# Patient Record
Sex: Female | Born: 1995 | Race: Black or African American | Hispanic: No | Marital: Single | Smoking: Current some day smoker
Health system: Southern US, Community
[De-identification: ages and names within clinical notes are randomized; demographics above are authoritative.]

## PROBLEM LIST (undated history)

## (undated) ENCOUNTER — Inpatient Hospital Stay (HOSPITAL_COMMUNITY): Payer: Self-pay

## (undated) DIAGNOSIS — Z789 Other specified health status: Secondary | ICD-10-CM

## (undated) HISTORY — PX: NO PAST SURGERIES: SHX2092

---
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
## 2015-01-09 ENCOUNTER — Inpatient Hospital Stay (HOSPITAL_COMMUNITY)
Admission: AD | Admit: 2015-01-09 | Discharge: 2015-01-09 | Disposition: A | Discharge status: Home / Self Care | Payer: Self-pay | Source: Ambulatory Visit | Attending: Family Medicine | Admitting: Family Medicine

## 2015-01-09 ENCOUNTER — Encounter (HOSPITAL_COMMUNITY): Payer: Self-pay | Admitting: Student

## 2015-01-09 ENCOUNTER — Inpatient Hospital Stay (HOSPITAL_COMMUNITY): Payer: Self-pay

## 2015-01-09 ENCOUNTER — Inpatient Hospital Stay (HOSPITAL_COMMUNITY): Payer: Medicaid Other

## 2015-01-09 DIAGNOSIS — O23599 Infection of other part of genital tract in pregnancy, unspecified trimester: Secondary | ICD-10-CM | POA: Insufficient documentation

## 2015-01-09 DIAGNOSIS — O3680X0 Pregnancy with inconclusive fetal viability, not applicable or unspecified: Secondary | ICD-10-CM

## 2015-01-09 DIAGNOSIS — B373 Candidiasis of vulva and vagina: Secondary | ICD-10-CM | POA: Insufficient documentation

## 2015-01-09 DIAGNOSIS — O98819 Other maternal infectious and parasitic diseases complicating pregnancy, unspecified trimester: Secondary | ICD-10-CM | POA: Insufficient documentation

## 2015-01-09 DIAGNOSIS — B9689 Other specified bacterial agents as the cause of diseases classified elsewhere: Secondary | ICD-10-CM | POA: Insufficient documentation

## 2015-01-09 DIAGNOSIS — Z3A00 Weeks of gestation of pregnancy not specified: Secondary | ICD-10-CM | POA: Insufficient documentation

## 2015-01-09 DIAGNOSIS — O468X1 Other antepartum hemorrhage, first trimester: Secondary | ICD-10-CM

## 2015-01-09 DIAGNOSIS — O23591 Infection of other part of genital tract in pregnancy, first trimester: Secondary | ICD-10-CM

## 2015-01-09 DIAGNOSIS — R42 Dizziness and giddiness: Secondary | ICD-10-CM | POA: Insufficient documentation

## 2015-01-09 DIAGNOSIS — O209 Hemorrhage in early pregnancy, unspecified: Secondary | ICD-10-CM | POA: Insufficient documentation

## 2015-01-09 DIAGNOSIS — O98811 Other maternal infectious and parasitic diseases complicating pregnancy, first trimester: Secondary | ICD-10-CM

## 2015-01-09 DIAGNOSIS — A499 Bacterial infection, unspecified: Secondary | ICD-10-CM

## 2015-01-09 DIAGNOSIS — B3731 Acute candidiasis of vulva and vagina: Secondary | ICD-10-CM

## 2015-01-09 DIAGNOSIS — O9989 Other specified diseases and conditions complicating pregnancy, childbirth and the puerperium: Secondary | ICD-10-CM | POA: Insufficient documentation

## 2015-01-09 DIAGNOSIS — N760 Acute vaginitis: Secondary | ICD-10-CM | POA: Insufficient documentation

## 2015-01-09 HISTORY — DX: Other specified health status: Z78.9

## 2015-01-09 LAB — COMPREHENSIVE METABOLIC PANEL
ALBUMIN: 4 g/dL (ref 3.5–5.0)
ALK PHOS: 58 U/L (ref 38–126)
ALT: 40 U/L (ref 14–54)
ANION GAP: 9 (ref 5–15)
AST: 53 U/L — AB (ref 15–41)
BILIRUBIN TOTAL: 1.3 mg/dL — AB (ref 0.3–1.2)
BUN: 11 mg/dL (ref 6–20)
CALCIUM: 9.1 mg/dL (ref 8.9–10.3)
CO2: 23 mmol/L (ref 22–32)
Chloride: 101 mmol/L (ref 101–111)
Creatinine, Ser: 0.83 mg/dL (ref 0.44–1.00)
GFR calc Af Amer: >60 mL/min (ref >60)
GLUCOSE: 92 mg/dL (ref 65–99)
Potassium: 3.9 mmol/L (ref 3.5–5.1)
Sodium: 133 mmol/L — ABNORMAL LOW (ref 135–145)
TOTAL PROTEIN: 7.7 g/dL (ref 6.5–8.1)

## 2015-01-09 LAB — URINALYSIS, ROUTINE W REFLEX MICROSCOPIC
Bilirubin Urine: NEGATIVE
GLUCOSE, UA: NEGATIVE mg/dL
KETONES UR: NEGATIVE mg/dL
LEUKOCYTES UA: NEGATIVE
NITRITE: NEGATIVE
PROTEIN: NEGATIVE mg/dL
Specific Gravity, Urine: 1.02 (ref 1.005–1.030)
pH: 6 (ref 5.0–8.0)

## 2015-01-09 LAB — URINE MICROSCOPIC-ADD ON
Bacteria, UA: NONE SEEN
WBC UA: NONE SEEN WBC/hpf (ref 0–5)

## 2015-01-09 LAB — CBC
HCT: 37.9 % (ref 36.0–46.0)
Hemoglobin: 12.4 g/dL (ref 12.0–15.0)
MCH: 26.3 pg (ref 26.0–34.0)
MCHC: 32.7 g/dL (ref 30.0–36.0)
MCV: 80.5 fL (ref 78.0–100.0)
PLATELETS: 247 10*3/uL (ref 150–400)
RBC: 4.71 MIL/uL (ref 3.87–5.11)
RDW: 15.4 % (ref 11.5–15.5)
WBC: 12.9 10*3/uL — ABNORMAL HIGH (ref 4.0–10.5)

## 2015-01-09 LAB — HCG, QUANTITATIVE, PREGNANCY: hCG, Beta Chain, Quant, S: 162 m[IU]/mL — ABNORMAL HIGH (ref <5)

## 2015-01-09 LAB — WET PREP, GENITAL
Sperm: NONE SEEN
Trich, Wet Prep: NONE SEEN

## 2015-01-09 LAB — POCT PREGNANCY, URINE: PREG TEST UR: POSITIVE — AB

## 2015-01-09 LAB — ABO/RH: ABO/RH(D): A POS

## 2015-01-09 MED ORDER — TERCONAZOLE 0.8 % VA CREA: 1.0000 | TOPICAL_CREAM | Freq: Every day | VAGINAL | Status: DC

## 2015-01-09 MED ORDER — METRONIDAZOLE 500 MG PO TABS: 500.0000 mg | ORAL_TABLET | Freq: Two times a day (BID) | ORAL | Status: DC

## 2015-01-09 NOTE — Discharge Instructions (Signed)
Return to care   If you have heavier bleeding that soaks through more that 2 pads per hour for an hour or more  If you bleed so much that you feel like you might pass out or you do pass out  If you have significant abdominal pain that is not improved with Tylenol   If you develop a fever > 100.5   Vaginal Bleeding During Pregnancy, First Trimester A small amount of bleeding (spotting) from the vagina is relatively common in early pregnancy. It usually stops on its own. Various things may cause bleeding or spotting in early pregnancy. Some bleeding may be related to the pregnancy, and some may not. In most cases, the bleeding is normal and is not a problem. However, bleeding can also be a sign of something serious. Be sure to tell your health care provider about any vaginal bleeding right away. Some possible causes of vaginal bleeding during the first trimester include:  Infection or inflammation of the cervix.  Growths (polyps) on the cervix.  Miscarriage or threatened miscarriage.  Pregnancy tissue has developed outside of the uterus and in a fallopian tube (tubal pregnancy).  Tiny cysts have developed in the uterus instead of pregnancy tissue (molar pregnancy). HOME CARE INSTRUCTIONS  Watch your condition for any changes. The following actions may help to lessen any discomfort you are feeling:  Follow your health care provider's instructions for limiting your activity. If your health care provider orders bed rest, you may need to stay in bed and only get up to use the bathroom. However, your health care provider may allow you to continue light activity.  If needed, make plans for someone to help with your regular activities and responsibilities while you are on bed rest.  Keep track of the number of pads you use each day, how often you change pads, and how soaked (saturated) they are. Write this down.  Do not use tampons. Do not douche.  Do not have sexual intercourse or orgasms  until approved by your health care provider.  If you pass any tissue from your vagina, save the tissue so you can show it to your health care provider.  Only take over-the-counter or prescription medicines as directed by your health care provider.  Do not take aspirin because it can make you bleed.  Keep all follow-up appointments as directed by your health care provider. SEEK MEDICAL CARE IF:  You have any vaginal bleeding during any part of your pregnancy.  You have cramps or labor pains.  You have a fever, not controlled by medicine. SEEK IMMEDIATE MEDICAL CARE IF:   You have severe cramps in your back or belly (abdomen).  You pass large clots or tissue from your vagina.  Your bleeding increases.  You feel light-headed or weak, or you have fainting episodes.  You have chills.  You are leaking fluid or have a gush of fluid from your vagina.  You pass out while having a bowel movement. MAKE SURE YOU:  Understand these instructions.  Will watch your condition.  Will get help right away if you are not doing well or get worse.   This information is not intended to replace advice given to you by your health care provider. Make sure you discuss any questions you have with your health care provider.   Document Released: 10/13/2004 Document Revised: 01/08/2013 Document Reviewed: 09/10/2012 Elsevier Interactive Patient Education 2016 Elsevier Inc.    Pelvic Rest Pelvic rest is sometimes recommended for women when:  The placenta is partially or completely covering the opening of the cervix (placenta previa).  There is bleeding between the uterine wall and the amniotic sac in the first trimester (subchorionic hemorrhage).  The cervix begins to open without labor starting (incompetent cervix, cervical insufficiency).  The labor is too early (preterm labor). HOME CARE INSTRUCTIONS  Do not have sexual intercourse, stimulation, or an orgasm.  Do not use tampons,  douche, or put anything in the vagina.  Do not lift anything over 10 pounds (4.5 kg).  Avoid strenuous activity or straining your pelvic muscles. SEEK MEDICAL CARE IF:  You have any vaginal bleeding during pregnancy. Treat this as a potential emergency.  You have cramping pain felt low in the stomach (stronger than menstrual cramps).  You notice vaginal discharge (watery, mucus, or bloody).  You have a low, dull backache.  There are regular contractions or uterine tightening. SEEK IMMEDIATE MEDICAL CARE IF: You have vaginal bleeding and have placenta previa.    This information is not intended to replace advice given to you by your health care provider. Make sure you discuss any questions you have with your health care provider.   Document Released: 04/30/2010 Document Revised: 03/28/2011 Document Reviewed: 07/07/2014 Elsevier Interactive Patient Education [employer] Elsevier Inc.

## 2015-01-09 NOTE — MAU Note (Signed)
RT in to perform EKG

## 2015-01-09 NOTE — MAU Note (Signed)
Period started last Wednesday, was very heavy, went off for a day, started bleeding again yesterday.  Has severe pain on sides bilaterally.  States she "blacked out" twice today, did not lose consciousness.  Nauseated this morning - no vomiting or diarrhea.

## 2015-01-09 NOTE — MAU Note (Signed)
Called respiratory therapy for EKG.  

## 2015-01-09 NOTE — MAU Note (Signed)
E Signature not working.

## 2015-01-09 NOTE — MAU Note (Signed)
Pt. Not in lobby for u/s

## 2015-01-09 NOTE — MAU Provider Note (Signed)
History     CSN: [id number]  Arrival date and time: 01/09/15 1054    First Provider Initiated Contact with Patient 01/09/15 1309      Chief Complaint  Patient presents with  . Vaginal Bleeding  . Abdominal Pain   HPI Michelle Dixon is a 19 y.o. female who presents with vaginal bleeding, abdominal pain, & episodes of "blacking out".  States her period started last Wednesday and has been heavier than normal. Last period before that was the middle of November.  Heavy bleeding last week. Has been light pink bleeding x 2 days.  Abdominal pain x 2 days. Felt sore. Worse on right side.  Denies chest pain or SOB.  At work & got light headed and dizzy & felt like she was going to pass out.  Didn't pass out but states she "blacked out". Symptoms resolved after sitting down.   2 episodes today of dizziness and vision going black for a few seconds.   OB History    Gravida Para Term Preterm AB TAB SAB Ectopic Multiple Living   1               Past Medical History  Diagnosis Date  . Medical history non-contributory     Past Surgical History  Procedure Laterality Date  . No past surgeries      History reviewed. No pertinent family history.  Social History  Substance Use Topics  . Smoking status: Never Smoker   . Smokeless tobacco: None  . Alcohol Use: No    Allergies: No Known Allergies  Prescriptions prior to admission  Medication Sig Dispense Refill Last Dose  . LEVORA 0.15/30, 28, 0.15-30 MG-MCG tablet TK AS DIRECTED  2     Review of Systems  Constitutional: Negative.   Gastrointestinal: Positive for abdominal pain.  Genitourinary:       + vaginal bleeding  Neurological: Positive for dizziness and loss of consciousness.   Physical Exam   Blood pressure 116/67, pulse 115, temperature 98.7 F (37.1 C), temperature source Oral, resp. rate 16, last menstrual period 12/01/2014.   Orthostatic VS for the past 24 hrs:  BP- Lying Pulse- Lying BP- Sitting Pulse-  Sitting BP- Standing at 0 minutes Pulse- Standing at 0 minutes  01/09/15 1543 111/59 mmHg 95 110/53 mmHg 97 107/56 mmHg 113       Physical Exam  Nursing note and vitals reviewed. Constitutional: She is oriented to person, place, and time. She appears well-developed and well-nourished. No distress.  HENT:  Head: Normocephalic and atraumatic.  Eyes: Conjunctivae are normal. Right eye exhibits no discharge. Left eye exhibits no discharge. No scleral icterus.  Neck: Normal range of motion.  Cardiovascular: Normal rate, regular rhythm and normal heart sounds.   No murmur heard. Respiratory: Effort normal and breath sounds normal. No respiratory distress. She has no wheezes.  GI: Soft. Bowel sounds are normal. She exhibits no distension and no mass. There is no tenderness. There is no rebound and no guarding.  Genitourinary: Cervix exhibits no motion tenderness.  Minimal amount of dark red blood on exam.  Cervix closed  Neurological: She is alert and oriented to person, place, and time.  Skin: Skin is warm and dry. She is not diaphoretic.  Psychiatric: She has a normal mood and affect. Her behavior is normal. Judgment and thought content normal.    MAU Course  Procedures Results for orders placed or performed during the hospital encounter of 01/09/15 (from the past 24 hour(s))  Urinalysis, Routine  w reflex microscopic (not [facility])     Status: Abnormal   Collection Time: 01/09/15 11:25 AM  Result Value Ref Range   Color, Urine YELLOW YELLOW   APPearance HAZY (A) CLEAR   Specific Gravity, Urine 1.020 1.005 - 1.030   pH 6.0 5.0 - 8.0   Glucose, UA NEGATIVE NEGATIVE mg/dL   Hgb urine dipstick MODERATE (A) NEGATIVE   Bilirubin Urine NEGATIVE NEGATIVE   Ketones, ur NEGATIVE NEGATIVE mg/dL   Protein, ur NEGATIVE NEGATIVE mg/dL   Nitrite NEGATIVE NEGATIVE   Leukocytes, UA NEGATIVE NEGATIVE  Urine microscopic-add on     Status: Abnormal   Collection Time: 01/09/15 11:25 AM  Result  Value Ref Range   Squamous Epithelial / LPF 0-5 (A) NONE SEEN   WBC, UA NONE SEEN 0 - 5 WBC/hpf   RBC / HPF 0-5 0 - 5 RBC/hpf   Bacteria, UA NONE SEEN NONE SEEN  Pregnancy, urine POC     Status: Abnormal   Collection Time: 01/09/15 11:56 AM  Result Value Ref Range   Preg Test, Ur POSITIVE (A) NEGATIVE  CBC     Status: Abnormal   Collection Time: 01/09/15  1:09 PM  Result Value Ref Range   WBC 12.9 (H) 4.0 - 10.5 K/uL   RBC 4.71 3.87 - 5.11 MIL/uL   Hemoglobin 12.4 12.0 - 15.0 g/dL   HCT 95.637.9 21.336.0 - 08.646.0 %   MCV 80.5 78.0 - 100.0 fL   MCH 26.3 26.0 - 34.0 pg   MCHC 32.7 30.0 - 36.0 g/dL   RDW 57.815.4 46.911.5 - 62.915.5 %   Platelets 247 150 - 400 K/uL  ABO/Rh     Status: None (Preliminary result)   Collection Time: 01/09/15  1:09 PM  Result Value Ref Range   ABO/RH(D) A POS   hCG, quantitative, pregnancy     Status: Abnormal   Collection Time: 01/09/15  1:09 PM  Result Value Ref Range   hCG, Beta Chain, Quant, S 162 (H) <5 mIU/mL  Comprehensive metabolic panel     Status: Abnormal   Collection Time: 01/09/15  1:09 PM  Result Value Ref Range   Sodium 133 (L) 135 - 145 mmol/L   Potassium 3.9 3.5 - 5.1 mmol/L   Chloride 101 101 - 111 mmol/L   CO2 23 22 - 32 mmol/L   Glucose, Bld 92 65 - 99 mg/dL   BUN 11 6 - 20 mg/dL   Creatinine, Ser 5.280.83 0.44 - 1.00 mg/dL   Calcium 9.1 8.9 - 41.310.3 mg/dL   Total Protein 7.7 6.5 - 8.1 g/dL   Albumin 4.0 3.5 - 5.0 g/dL   AST 53 (H) 15 - 41 U/L   ALT 40 14 - 54 U/L   Alkaline Phosphatase 58 38 - 126 U/L   Total Bilirubin 1.3 (H) 0.3 - 1.2 mg/dL   GFR calc non Af Amer >60 >60 mL/min   GFR calc Af Amer >60 >60 mL/min   Anion gap 9 5 - 15  Wet prep, genital     Status: Abnormal   Collection Time: 01/09/15  2:50 PM  Result Value Ref Range   Yeast Wet Prep HPF POC PRESENT (A) NONE SEEN   Trich, Wet Prep NONE SEEN NONE SEEN   Clue Cells Wet Prep HPF POC PRESENT (A) NONE SEEN   WBC, Wet Prep HPF POC FEW (A) NONE SEEN   Sperm NONE SEEN    [place] Ob Comp  Less 14 Wks  01/09/2015  CLINICAL DATA:  Bleeding, pelvic pain EXAM: OBSTETRIC <14 WK [place] AND TRANSVAGINAL OB US TECHNIQUE: Both transabdominal and transvaginal ultrasound examinations were performed for complete evaluation of the gestation as well as the maternal uterus, adnexal regions, and pelvic cul-de-sac. Transvaginal technique was performed to assess early pregnancy. COMPARISON:  None. FINDINGS: Intrauterine gestational sac: Not visualized Yolk sac:  Not visualized Embryo:  Not visualized Subchorionic hemorrhage:  None visualized. Maternal uterus/adnexae: The uterus is retroverted. No adnexal masses. Complex free fluid noted in the pelvis in both adnexa with internal echoes. IMPRESSION: No intrauterine pregnancy visualized. Differential considerations would include early intrauterine pregnancy too early to visualize, spontaneous abortion, or occult ectopic pregnancy. Recommend close clinical followup and serial quantitative beta HCGs and ultrasounds. Complex free fluid in the adnexae bilaterally which could represent blood/hemoperitoneum or pelvic inflammatory disease. Electronically Signed   By: Charlett Nose M.D.   On: 01/09/2015 15:35   US Ob Transvaginal  01/09/2015  CLINICAL DATA:  Bleeding, pelvic pain EXAM: OBSTETRIC <14 WK [place] AND TRANSVAGINAL OB US TECHNIQUE: Both transabdominal and transvaginal ultrasound examinations were performed for complete evaluation of the gestation as well as the maternal uterus, adnexal regions, and pelvic cul-de-sac. Transvaginal technique was performed to assess early pregnancy. COMPARISON:  None. FINDINGS: Intrauterine gestational sac: Not visualized Yolk sac:  Not visualized Embryo:  Not visualized Subchorionic hemorrhage:  None visualized. Maternal uterus/adnexae: The uterus is retroverted. No adnexal masses. Complex free fluid noted in the pelvis in both adnexa with internal echoes. IMPRESSION: No intrauterine pregnancy visualized. Differential considerations would  include early intrauterine pregnancy too early to visualize, spontaneous abortion, or occult ectopic pregnancy. Recommend close clinical followup and serial quantitative beta HCGs and ultrasounds. Complex free fluid in the adnexae bilaterally which could represent blood/hemoperitoneum or pelvic inflammatory disease. Electronically Signed   By: Charlett Nose M.D.   On: 01/09/2015 15:35   MDM UPT positive - will order ectopic work up A positive EKG sinus tachy Orthostatic VS normal Ultrasound - no IUP  Assessment and Plan  A:  1. Pregnancy of unknown anatomic location   2. Vaginal bleeding in pregnancy, first trimester   3. Vaginal yeast infection   4. BV (bacterial vaginosis)    P: Discharge home Return to MAU in 48 hrs for f/u BHCG Discussed reasons to return to MAU Rx terazol & flagyl Pelvic rest GC/CT, HIV pending  Judeth Horn, NP  01/09/2015, 2:18 PM

## 2015-01-11 ENCOUNTER — Inpatient Hospital Stay (HOSPITAL_COMMUNITY)
Admission: AD | Admit: 2015-01-11 | Discharge: 2015-01-11 | Disposition: A | Discharge status: Home / Self Care | Payer: Self-pay | Source: Ambulatory Visit | Attending: Family Medicine | Admitting: Family Medicine

## 2015-01-11 DIAGNOSIS — O209 Hemorrhage in early pregnancy, unspecified: Secondary | ICD-10-CM | POA: Insufficient documentation

## 2015-01-11 DIAGNOSIS — O039 Complete or unspecified spontaneous abortion without complication: Secondary | ICD-10-CM | POA: Insufficient documentation

## 2015-01-11 DIAGNOSIS — Z3A01 Less than 8 weeks gestation of pregnancy: Secondary | ICD-10-CM | POA: Insufficient documentation

## 2015-01-11 LAB — HCG, QUANTITATIVE, PREGNANCY: HCG, BETA CHAIN, QUANT, S: 79 m[IU]/mL — AB (ref <5)

## 2015-01-11 NOTE — MAU Provider Note (Signed)
Ms. Michelle Dixon  is a 19 y.o. G1P0 at [id number] who presents to MAU today for follow-up quant hCG after 48 hours. The patient denies abdominal pain today. She continues to have spotting. Ultrasound on 01/09/15 did not show anything in the uterus or the adnexa.    BP 90/56 mmHg  Pulse 79  Temp(Src) 98.1 F (36.7 C) (Oral)  Resp 16  LMP 12/01/2014 (Within Weeks)  CONSTITUTIONAL: Well-developed, well-nourished female in no acute distress.  ENT: External right and left ear normal.  EYES: EOM intact, conjunctivae normal.  MUSCULOSKELETAL: Normal range of motion.  CARDIOVASCULAR: Regular heart rate RESPIRATORY: Normal effort NEUROLOGICAL: Alert and oriented to person, place, and time.  SKIN: Skin is warm and dry. No rash noted. Not diaphoretic. No erythema. No pallor. PSYCH: Normal mood and affect. Normal behavior. Normal judgment and thought content.  Results for Michelle Dixon, Michelle Dixon (MRN [id number]) as of 01/11/2015 15:55  Ref. Range 01/09/2015 13:09 01/09/2015 14:25 01/09/2015 14:50 01/09/2015 15:31 01/11/2015 15:05  HCG, Beta Chain, Quant, S Latest Ref Range: <5 mIU/mL 162 (H)    79 (H)    A: SAB  P: Discharge home Bleeding precautions discussed Comfort care given [facility] ~ 2 weeks. They will call her with an appointment.  Patient may return to MAU as needed or if her condition were to change or worsen   Marny LowensteinJulie N Loranzo Desha, PA-C 01/11/2015 3:54 PM

## 2015-01-11 NOTE — Discharge Instructions (Signed)
Incomplete Miscarriage A miscarriage is the sudden loss of an unborn baby (fetus) before the 20th week of pregnancy. In an incomplete miscarriage, parts of the fetus or placenta (afterbirth) remain in the body.  Having a miscarriage can be an emotional experience. Talk with your health care provider about any questions you may have about miscarrying, the grieving process, and your future pregnancy plans. CAUSES   Problems with the fetal chromosomes that make it impossible for the baby to develop normally. Problems with the baby's genes or chromosomes are most often the result of errors that occur by chance as the embryo divides and grows. The problems are not inherited from the parents.  Infection of the cervix or uterus.  Hormone problems.  Problems with the cervix, such as having an incompetent cervix. This is when the tissue in the cervix is not strong enough to hold the pregnancy.  Problems with the uterus, such as an abnormally shaped uterus, uterine fibroids, or congenital abnormalities.  Certain medical conditions.  Smoking, drinking alcohol, or taking illegal drugs.  Trauma. SYMPTOMS   Vaginal bleeding or spotting, with or without cramps or pain.  Pain or cramping in the abdomen or lower back.  Passing fluid, tissue, or blood clots from the vagina. DIAGNOSIS  Your health care provider will perform a physical exam. You may also have an ultrasound to confirm the miscarriage. Blood or urine tests may also be ordered. TREATMENT   Usually, a dilation and curettage (D&C) procedure is performed. During a D&C procedure, the cervix is widened (dilated) and any remaining fetal or placental tissue is gently removed from the uterus.  Antibiotic medicines are prescribed if there is an infection. Other medicines may be given to reduce the size of the uterus (contract) if there is a lot of bleeding.  If you have Rh negative blood and your baby was Rh positive, you will need a Rho (D)  immune globulin shot. This shot will protect any future baby from having Rh blood problems in future pregnancies.  You may be confined to bed rest. This means you should stay in bed and only get up to use the bathroom. HOME CARE INSTRUCTIONS   Rest as directed by your health care provider.  Restrict activity as directed by your health care provider. You may be allowed to continue light activity if curettage was not done but you require further treatment.  Keep track of the number of pads you use each day. Keep track of how soaked (saturated) they are. Record this information.  Do not  use tampons.  Do not douche or have sexual intercourse until approved by your health care provider.  Keep all follow-up appointments for reevaluation and continuing management.  Only take over-the-counter or prescription medicines for pain, fever, or discomfort as directed by your health care provider.  Take antibiotic medicine as directed by your health care provider. Make sure you finish it even if you start to feel better. SEEK IMMEDIATE MEDICAL CARE IF:   You experience severe cramps in your stomach, back, or abdomen.  You have an unexplained temperature (make sure to record these temperatures).  You pass large clots or tissue (save these for your health care provider to inspect).  Your bleeding increases.  You become light-headed, weak, or have fainting episodes. MAKE SURE YOU:   Understand these instructions.  Will watch your condition.  Will get help right away if you are not doing well or get worse.   This information is not intended to   replace advice given to you by your health care provider. Make sure you discuss any questions you have with your health care provider.   Document Released: 01/03/2005 Document Revised: 01/24/2014 Document Reviewed: 08/02/2012 Elsevier Interactive Patient Education 2016 [employer] Inc.  

## 2015-01-11 NOTE — MAU Note (Signed)
Here for follow up, doing fine today.  No pain.  spotting

## 2015-01-17 LAB — GC/CHLAMYDIA PROBE AMP (~~LOC~~) NOT AT ARMC
Chlamydia: NEGATIVE
Neisseria Gonorrhea: NEGATIVE

## 2015-01-26 ENCOUNTER — Encounter: Payer: Self-pay | Admitting: Obstetrics and Gynecology

## 2015-01-28 ENCOUNTER — Inpatient Hospital Stay (HOSPITAL_COMMUNITY)
Admission: AD | Admit: 2015-01-28 | Discharge: 2015-01-28 | Discharge status: Absent without leave | Payer: Self-pay | Source: Ambulatory Visit | Attending: Family Medicine | Admitting: Family Medicine

## 2015-01-28 ENCOUNTER — Ambulatory Visit (INDEPENDENT_AMBULATORY_CARE_PROVIDER_SITE_OTHER): Payer: Self-pay | Admitting: Obstetrics & Gynecology

## 2015-01-28 ENCOUNTER — Encounter: Payer: Self-pay | Admitting: Obstetrics & Gynecology

## 2015-01-28 VITALS — BP 101/60 | HR 65 | Temp 97.9°F | Ht 64.0 in | Wt 118.0 lb

## 2015-01-28 DIAGNOSIS — O039 Complete or unspecified spontaneous abortion without complication: Secondary | ICD-10-CM

## 2015-01-28 NOTE — Patient Instructions (Addendum)
Miscarriage A miscarriage is the sudden loss of an unborn baby (fetus) before the 20th week of pregnancy. Most miscarriages happen in the first 3 months of pregnancy. Sometimes, it happens before a woman even knows she is pregnant. A miscarriage is also called a "spontaneous miscarriage" or "early pregnancy loss." Having a miscarriage can be an emotional experience. Talk with your caregiver about any questions you may have about miscarrying, the grieving process, and your future pregnancy plans. CAUSES   Problems with the fetal chromosomes that make it impossible for the baby to develop normally. Problems with the baby's genes or chromosomes are most often the result of errors that occur, by chance, as the embryo divides and grows. The problems are not inherited from the parents.  Infection of the cervix or uterus.   Hormone problems.   Problems with the cervix, such as having an incompetent cervix. This is when the tissue in the cervix is not strong enough to hold the pregnancy.   Problems with the uterus, such as an abnormally shaped uterus, uterine fibroids, or congenital abnormalities.   Certain medical conditions.   Smoking, drinking alcohol, or taking illegal drugs.   Trauma.  Often, the cause of a miscarriage is unknown.  SYMPTOMS   Vaginal bleeding or spotting, with or without cramps or pain.  Pain or cramping in the abdomen or lower back.  Passing fluid, tissue, or blood clots from the vagina. DIAGNOSIS  Your caregiver will perform a physical exam. You may also have an ultrasound to confirm the miscarriage. Blood or urine tests may also be ordered. TREATMENT   Sometimes, treatment is not necessary if you naturally pass all the fetal tissue that was in the uterus. If some of the fetus or placenta remains in the body (incomplete miscarriage), tissue left behind may become infected and must be removed. Usually, a dilation and curettage (D and C) procedure is performed.  During a D and C procedure, the cervix is widened (dilated) and any remaining fetal or placental tissue is gently removed from the uterus.  Antibiotic medicines are prescribed if there is an infection. Other medicines may be given to reduce the size of the uterus (contract) if there is a lot of bleeding.  If you have Rh negative blood and your baby was Rh positive, you will need a Rh immunoglobulin shot. This shot will protect any future baby from having Rh blood problems in future pregnancies. HOME CARE INSTRUCTIONS   Your caregiver may order bed rest or may allow you to continue light activity. Resume activity as directed by your caregiver.  Have someone help with home and family responsibilities during this time.   Keep track of the number of sanitary pads you use each day and how soaked (saturated) they are. Write down this information.   Do not use tampons. Do not douche or have sexual intercourse until approved by your caregiver.   Only take over-the-counter or prescription medicines for pain or discomfort as directed by your caregiver.   Do not take aspirin. Aspirin can cause bleeding.   Keep all follow-up appointments with your caregiver.   If you or your partner have problems with grieving, talk to your caregiver or seek counseling to help cope with the pregnancy loss. Allow enough time to grieve before trying to get pregnant again.  SEEK IMMEDIATE MEDICAL CARE IF:   You have severe cramps or pain in your back or abdomen.  You have a fever.  You pass large blood clots (walnut-sized   or larger) ortissue from your vagina. Save any tissue for your caregiver to inspect.   Your bleeding increases.   You have a thick, bad-smelling vaginal discharge.  You become lightheaded, weak, or you faint.   You have chills.  MAKE SURE YOU:  Understand these instructions.  Will watch your condition.  Will get help right away if you are not doing well or get worse.   This  information is not intended to replace advice given to you by your health care provider. Make sure you discuss any questions you have with your health care provider.   Document Released: 06/29/2000 Document Revised: 04/30/2012 Document Reviewed: 02/22/2011 Elsevier Interactive Patient Education 2016 [employer] Levonorgestrel intrauterine device (IUD) What is this medicine? LEVONORGESTREL IUD (LEE voe nor jes trel) is a contraceptive (birth control) device. The device is placed inside the uterus by a healthcare professional. It is used to prevent pregnancy and can also be used to treat heavy bleeding that occurs during your period. Depending on the device, it can be used for 3 to 5 years. This medicine may be used for other purposes; ask your health care provider or pharmacist if you have questions. What should I tell my health care provider before I take this medicine? They need to know if you have any of these conditions: -abnormal Pap smear -cancer of the breast, uterus, or cervix -diabetes -endometritis -genital or pelvic infection now or in the past -have more than one sexual partner or your partner has more than one partner -heart disease -history of an ectopic or tubal pregnancy -immune system problems -IUD in place -liver disease or tumor -problems with blood clots or take blood-thinners -use intravenous drugs -uterus of unusual shape -vaginal bleeding that has not been explained -an unusual or allergic reaction to levonorgestrel, other hormones, silicone, or polyethylene, medicines, foods, dyes, or preservatives -pregnant or trying to get pregnant -breast-feeding How should I use this medicine? This device is placed inside the uterus by a health care professional. Talk to your pediatrician regarding the use of this medicine in children. Special care may be needed. Overdosage: If you think you have taken too much of this medicine contact a poison control center or emergency  room at once. NOTE: This medicine is only for you. Do not share this medicine with others. What if I miss a dose? This does not apply. What may interact with this medicine? Do not take this medicine with any of the following medications: -amprenavir -bosentan -fosamprenavir This medicine may also interact with the following medications: -aprepitant -barbiturate medicines for inducing sleep or treating seizures -bexarotene -griseofulvin -medicines to treat seizures like carbamazepine, ethotoin, felbamate, oxcarbazepine, phenytoin, topiramate -modafinil -pioglitazone -rifabutin -rifampin -rifapentine -some medicines to treat HIV infection like atazanavir, indinavir, lopinavir, nelfinavir, tipranavir, ritonavir -St. John's wort -warfarin This list may not describe all possible interactions. Give your health care provider a list of all the medicines, herbs, non-prescription drugs, or dietary supplements you use. Also tell them if you smoke, drink alcohol, or use illegal drugs. Some items may interact with your medicine. What should I watch for while using this medicine? Visit your doctor or health care professional for regular check ups. See your doctor if you or your partner has sexual contact with others, becomes HIV positive, or gets a sexual transmitted disease. This product does not protect you against HIV infection (AIDS) or other sexually transmitted diseases. You can check the placement of the IUD yourself by reaching up to the top of  your vagina with clean fingers to feel the threads. Do not pull on the threads. It is a good habit to check placement after each menstrual period. Call your doctor right away if you feel more of the IUD than just the threads or if you cannot feel the threads at all. The IUD may come out by itself. You may become pregnant if the device comes out. If you notice that the IUD has come out use a backup birth control method like condoms and call your health  care provider. Using tampons will not change the position of the IUD and are okay to use during your period. What side effects may I notice from receiving this medicine? Side effects that you should report to your doctor or health care professional as soon as possible: -allergic reactions like skin rash, itching or hives, swelling of the face, lips, or tongue -fever, flu-like symptoms -genital sores -high blood pressure -no menstrual period for 6 weeks during use -pain, swelling, warmth in the leg -pelvic pain or tenderness -severe or sudden headache -signs of pregnancy -stomach cramping -sudden shortness of breath -trouble with balance, talking, or walking -unusual vaginal bleeding, discharge -yellowing of the eyes or skin Side effects that usually do not require medical attention (report to your doctor or health care professional if they continue or are bothersome): -acne -breast pain -change in sex drive or performance -changes in weight -cramping, dizziness, or faintness while the device is being inserted -headache -irregular menstrual bleeding within first 3 to 6 months of use -nausea This list may not describe all possible side effects. Call your doctor for medical advice about side effects. You may report side effects to FDA at 1-800-FDA-1088. Where should I keep my medicine? This does not apply. NOTE: This sheet is a summary. It may not cover all possible information. If you have questions about this medicine, talk to your doctor, pharmacist, or health care provider.    2016, Elsevier/Gold Standard. (2011-02-03 13:54:04)

## 2015-01-28 NOTE — Progress Notes (Signed)
Patient ID: Michelle OrleansKirstin Curran, female   DOB: 11/08/1995, 20 y.o.   MRN: [id number] History:  20 y.o. G1P0 here today for f/u of SAB.  Pt was on OCP's and missed pills and got pregnant.  Pt s/p SAB 01/09/2015.  Denies bleeding since that time.  She also denies pain.  She has a rx for OCPs at home and does not have insurance so wants to f[facility] at the [facility] for [facility].  The following portions of the patient's history were reviewed and updated as appropriate: allergies, current medications, past family history, past medical history, past social history, past surgical history and problem list.  Review of Systems:  Pertinent items are noted in HPI.  Objective:  Physical Exam Blood pressure 101/60, pulse 65, temperature 97.9 F (36.6 C), temperature source Oral, height 5\' 4"  (1.626 m), weight 118 lb (53.524 kg), last menstrual period 12/01/2014, unknown if currently breastfeeding. Gen: NAD Abd: Soft, nontender and nondistended Pelvic: deferred   Labs and Imaging [place] Ob Comp Less 14 Wks  01/09/2015  CLINICAL DATA:  Bleeding, pelvic pain EXAM: OBSTETRIC <14 WK US AND TRANSVAGINAL OB US TECHNIQUE: Both transabdominal and transvaginal ultrasound examinations were performed for complete evaluation of the gestation as well as the maternal uterus, adnexal regions, and pelvic cul-de-sac. Transvaginal technique was performed to assess early pregnancy. COMPARISON:  None. FINDINGS: Intrauterine gestational sac: Not visualized Yolk sac:  Not visualized Embryo:  Not visualized Subchorionic hemorrhage:  None visualized. Maternal uterus/adnexae: The uterus is retroverted. No adnexal masses. Complex free fluid noted in the pelvis in both adnexa with internal echoes. IMPRESSION: No intrauterine pregnancy visualized. Differential considerations would include early intrauterine pregnancy too early to visualize, spontaneous abortion, or occult ectopic pregnancy. Recommend close clinical followup and serial quantitative beta HCGs and  ultrasounds. Complex free fluid in the adnexae bilaterally which could represent blood/hemoperitoneum or pelvic inflammatory disease. Electronically Signed   By: Charlett NoseKevin  Dover M.D.   On: 01/09/2015 15:35   [place] Ob Transvaginal  01/09/2015  CLINICAL DATA:  Bleeding, pelvic pain EXAM: OBSTETRIC <14 WK US AND TRANSVAGINAL OB US TECHNIQUE: Both transabdominal and transvaginal ultrasound examinations were performed for complete evaluation of the gestation as well as the maternal uterus, adnexal regions, and pelvic cul-de-sac. Transvaginal technique was performed to assess early pregnancy. COMPARISON:  None. FINDINGS: Intrauterine gestational sac: Not visualized Yolk sac:  Not visualized Embryo:  Not visualized Subchorionic hemorrhage:  None visualized. Maternal uterus/adnexae: The uterus is retroverted. No adnexal masses. Complex free fluid noted in the pelvis in both adnexa with internal echoes. IMPRESSION: No intrauterine pregnancy visualized. Differential considerations would include early intrauterine pregnancy too early to visualize, spontaneous abortion, or occult ectopic pregnancy. Recommend close clinical followup and serial quantitative beta HCGs and ultrasounds. Complex free fluid in the adnexae bilaterally which could represent blood/hemoperitoneum or pelvic inflammatory disease. Electronically Signed   By: Charlett NoseKevin  Dover M.D.   On: 01/09/2015 15:35    Assessment & Plan:  S/p SAB- doing well contraception management. Desires LARC will f/u at the [facility].  Reviewed Nexplanon vs Mirena. Pt to start OCP's on Sunday to take until she can get an appt at the [facility]  Byronarolyn L. Harraway-Smith, M.D., [facility]

## 2016-11-25 IMAGING — US US OB TRANSVAGINAL
1 series · 15 of 28 positions shown · non-contrast
Comparison: None.

CLINICAL DATA: Bleeding, pelvic pain

EXAM:
OBSTETRIC <14 WK US AND TRANSVAGINAL OB US
TECHNIQUE: Both transabdominal and transvaginal ultrasound examinations were
performed for complete evaluation of the gestation as well as the
maternal uterus, adnexal regions, and pelvic cul-de-sac.
Transvaginal technique was performed to assess early pregnancy.

[Series 1: us ob transvaginal · 38 acquisitions, 15 frames shown]
[im 1/38]
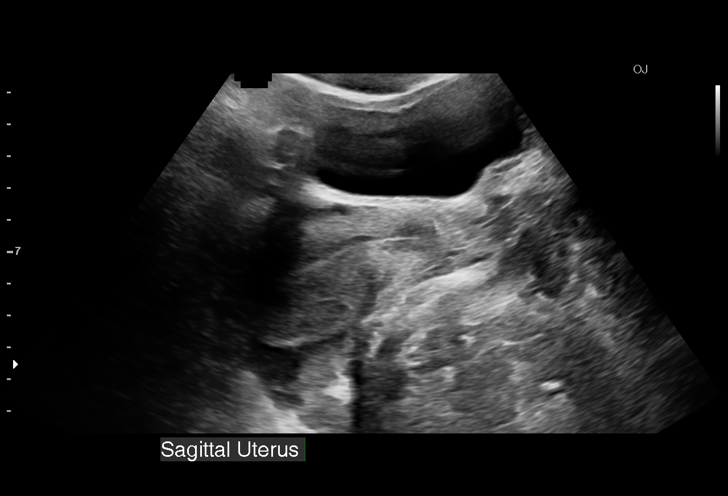
[im 3/38]
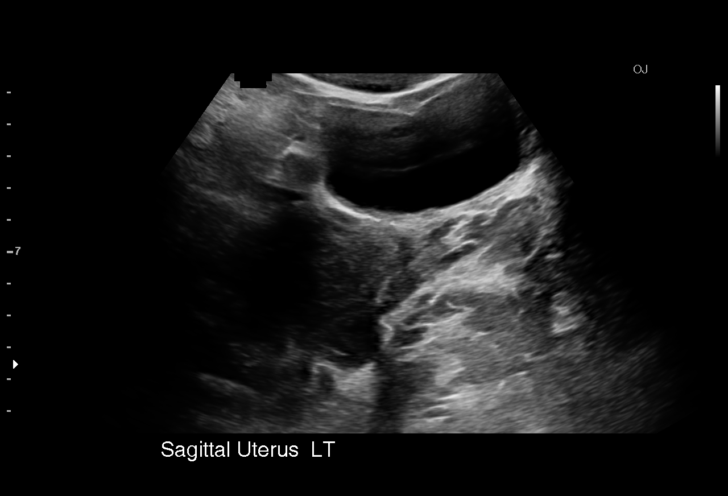
[im 6/38]
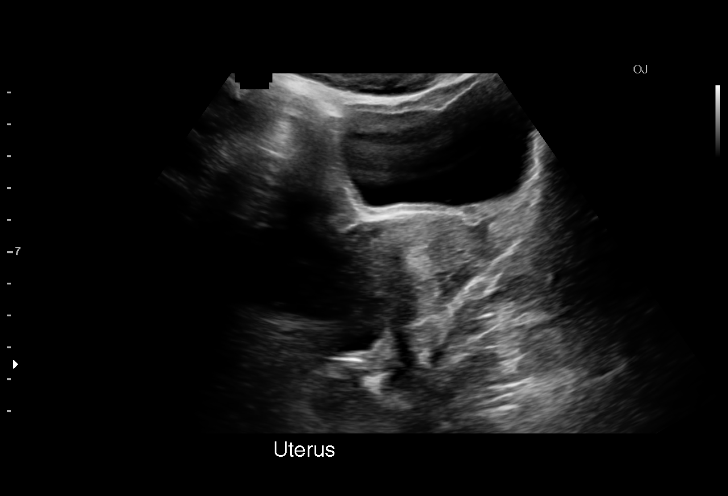
[im 9/38]
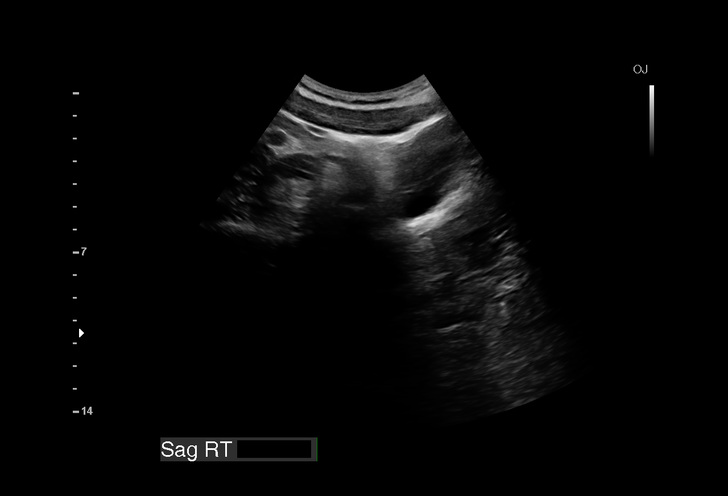
[im 11/38]
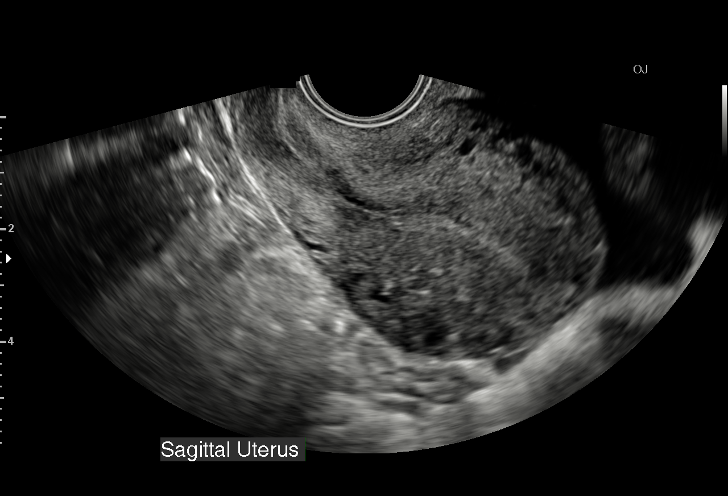
[im 14/38]
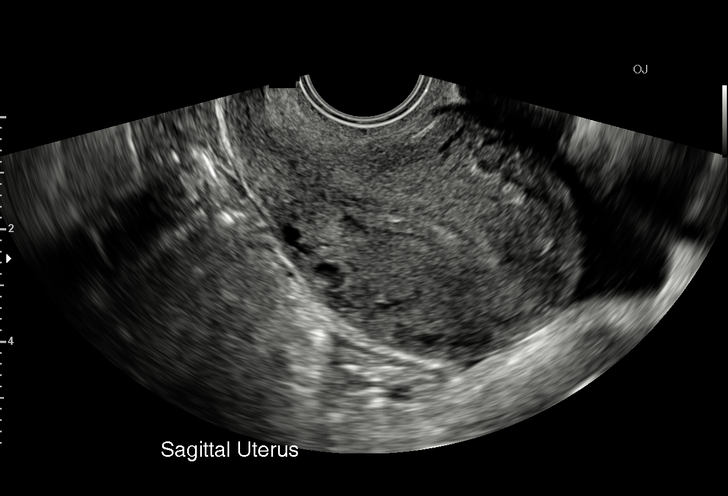
[im 17/38]
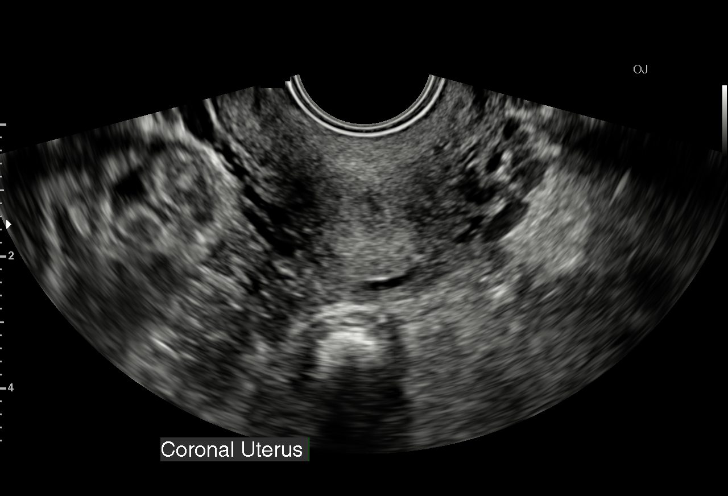
[im 20/38]
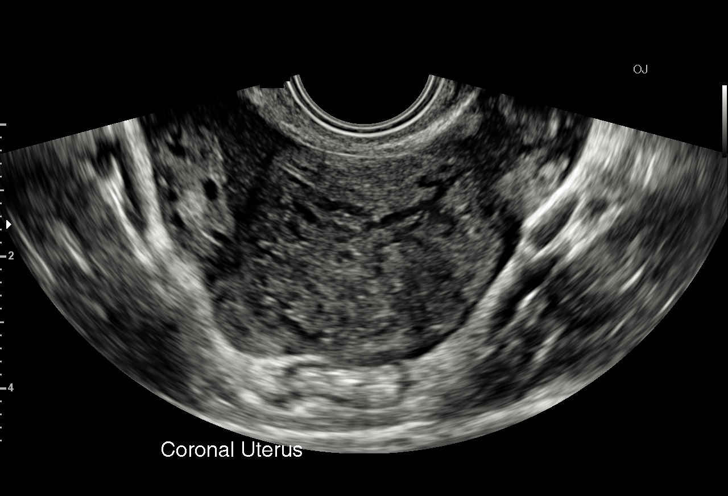
[im 21/38]
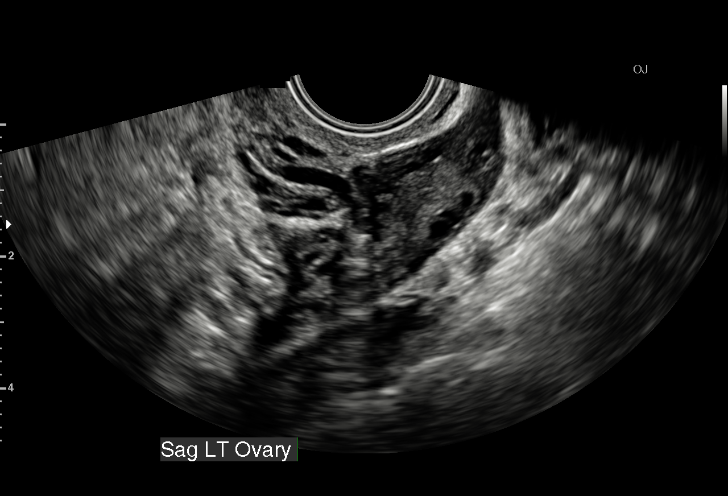
[im 24/38]
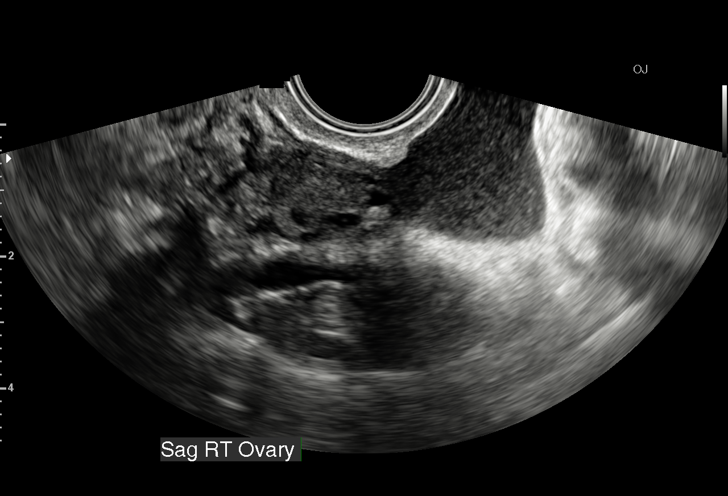
[im 27/38]
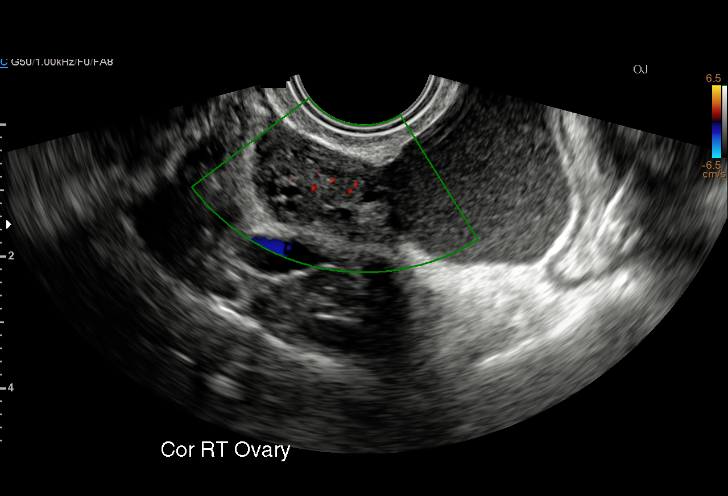
[im 29/38]
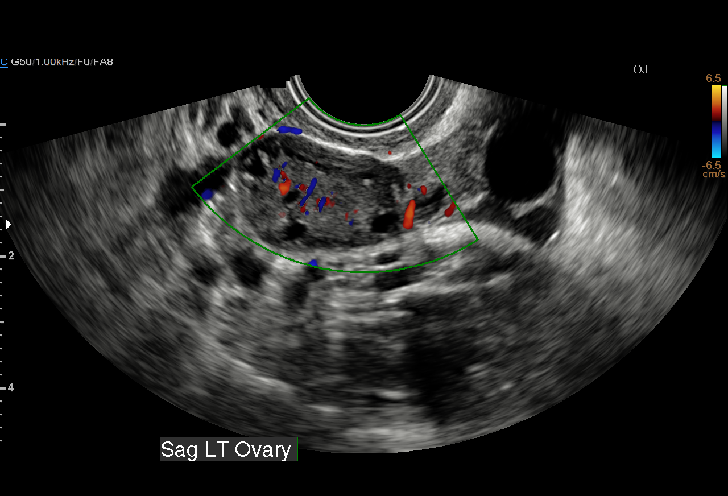
[im 32/38]
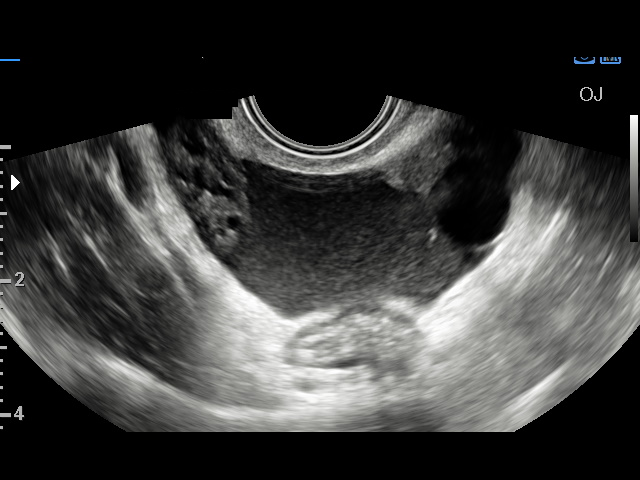
[im 35/38]
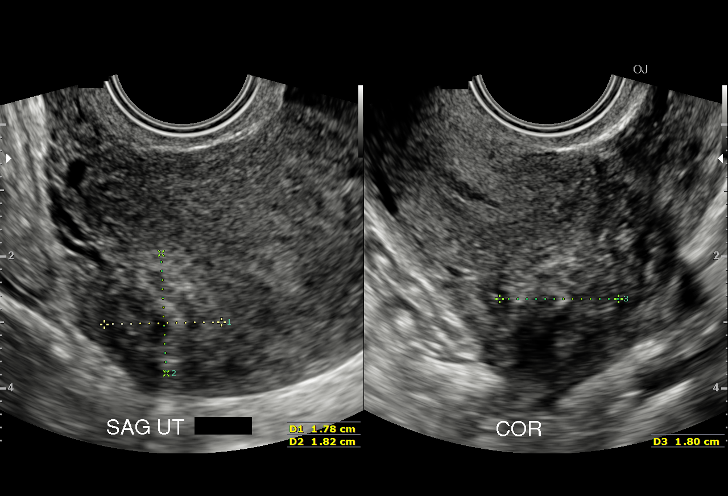
[im 38/38]
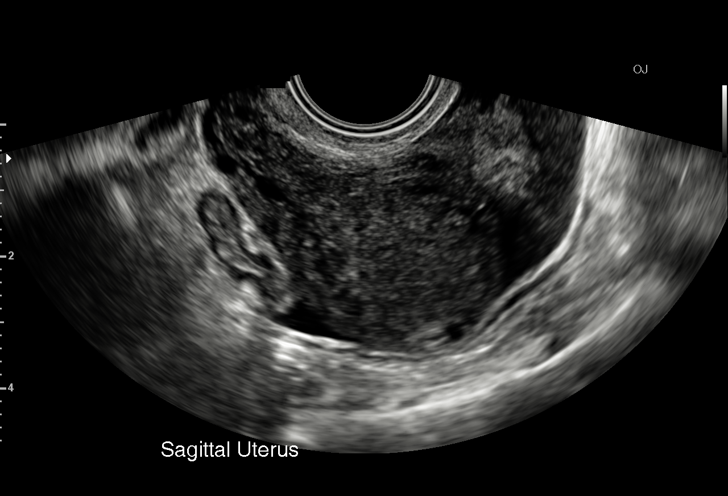

[15 of 28 positions shown; findings below may reference images not displayed]

FINDINGS: Intrauterine gestational sac: Not visualized

Yolk sac:  Not visualized

Embryo:  Not visualized

Subchorionic hemorrhage:  None visualized.

Maternal uterus/adnexae: The uterus is retroverted. No adnexal
masses. Complex free fluid noted in the pelvis in both adnexa with
internal echoes.
IMPRESSION: No intrauterine pregnancy visualized. Differential considerations
would include early intrauterine pregnancy too early to visualize,
spontaneous abortion, or occult ectopic pregnancy. Recommend close
clinical followup and serial quantitative beta HCGs and ultrasounds.

Complex free fluid in the adnexae bilaterally which could represent
blood/hemoperitoneum or pelvic inflammatory disease.

## 2018-01-29 DIAGNOSIS — Z118 Encounter for screening for other infectious and parasitic diseases: Secondary | ICD-10-CM | POA: Diagnosis not present

## 2018-01-29 DIAGNOSIS — Z6820 Body mass index (BMI) 20.0-20.9, adult: Secondary | ICD-10-CM | POA: Diagnosis not present

## 2018-01-29 DIAGNOSIS — Z01419 Encounter for gynecological examination (general) (routine) without abnormal findings: Secondary | ICD-10-CM | POA: Diagnosis not present

## 2018-01-29 DIAGNOSIS — Z0000 Encounter for general adult medical examination without abnormal findings: Secondary | ICD-10-CM | POA: Diagnosis not present

## 2018-01-29 DIAGNOSIS — Z131 Encounter for screening for diabetes mellitus: Secondary | ICD-10-CM | POA: Diagnosis not present

## 2018-01-29 DIAGNOSIS — Z130 Encounter for screening for diseases of the blood and blood-forming organs and certain disorders involving the immune mechanism: Secondary | ICD-10-CM | POA: Diagnosis not present

## 2018-01-29 DIAGNOSIS — Z1329 Encounter for screening for other suspected endocrine disorder: Secondary | ICD-10-CM | POA: Diagnosis not present

## 2018-04-03 DIAGNOSIS — Z79899 Other long term (current) drug therapy: Secondary | ICD-10-CM | POA: Diagnosis not present

## 2018-04-03 DIAGNOSIS — L910 Hypertrophic scar: Secondary | ICD-10-CM | POA: Diagnosis not present

## 2018-04-03 DIAGNOSIS — L700 Acne vulgaris: Secondary | ICD-10-CM | POA: Diagnosis not present

## 2018-04-03 DIAGNOSIS — L708 Other acne: Secondary | ICD-10-CM | POA: Diagnosis not present

## 2018-10-09 DIAGNOSIS — Z118 Encounter for screening for other infectious and parasitic diseases: Secondary | ICD-10-CM | POA: Diagnosis not present

## 2018-10-09 DIAGNOSIS — Z1159 Encounter for screening for other viral diseases: Secondary | ICD-10-CM | POA: Diagnosis not present

## 2018-10-09 DIAGNOSIS — Z113 Encounter for screening for infections with a predominantly sexual mode of transmission: Secondary | ICD-10-CM | POA: Diagnosis not present

## 2018-10-09 DIAGNOSIS — Z114 Encounter for screening for human immunodeficiency virus [HIV]: Secondary | ICD-10-CM | POA: Diagnosis not present

## 2018-12-14 DIAGNOSIS — Z20828 Contact with and (suspected) exposure to other viral communicable diseases: Secondary | ICD-10-CM | POA: Diagnosis not present

## 2019-01-10 DIAGNOSIS — Z20828 Contact with and (suspected) exposure to other viral communicable diseases: Secondary | ICD-10-CM | POA: Diagnosis not present

## 2019-02-21 DIAGNOSIS — N760 Acute vaginitis: Secondary | ICD-10-CM | POA: Diagnosis not present

## 2019-02-21 DIAGNOSIS — Z6821 Body mass index (BMI) 21.0-21.9, adult: Secondary | ICD-10-CM | POA: Diagnosis not present

## 2019-02-21 DIAGNOSIS — Z01419 Encounter for gynecological examination (general) (routine) without abnormal findings: Secondary | ICD-10-CM | POA: Diagnosis not present

## 2019-04-15 DIAGNOSIS — Z20828 Contact with and (suspected) exposure to other viral communicable diseases: Secondary | ICD-10-CM | POA: Diagnosis not present

## 2019-04-20 DIAGNOSIS — Z20828 Contact with and (suspected) exposure to other viral communicable diseases: Secondary | ICD-10-CM | POA: Diagnosis not present

## 2019-04-20 DIAGNOSIS — Z03818 Encounter for observation for suspected exposure to other biological agents ruled out: Secondary | ICD-10-CM | POA: Diagnosis not present

## 2019-06-25 DIAGNOSIS — B373 Candidiasis of vulva and vagina: Secondary | ICD-10-CM | POA: Diagnosis not present
